# Patient Record
Sex: Male | Born: 1965 | Race: White | Marital: Married | Smoking: Never smoker
Health system: Southern US, Community
[De-identification: ages and names within clinical notes are randomized; demographics above are authoritative.]

## PROBLEM LIST (undated history)

## (undated) DIAGNOSIS — T7840XA Allergy, unspecified, initial encounter: Secondary | ICD-10-CM

## (undated) DIAGNOSIS — Z86010 Personal history of colonic polyps: Secondary | ICD-10-CM

## (undated) DIAGNOSIS — I10 Essential (primary) hypertension: Secondary | ICD-10-CM

## (undated) DIAGNOSIS — E785 Hyperlipidemia, unspecified: Secondary | ICD-10-CM

## (undated) DIAGNOSIS — G43909 Migraine, unspecified, not intractable, without status migrainosus: Secondary | ICD-10-CM

## (undated) DIAGNOSIS — F419 Anxiety disorder, unspecified: Secondary | ICD-10-CM

## (undated) DIAGNOSIS — E119 Type 2 diabetes mellitus without complications: Secondary | ICD-10-CM

## (undated) DIAGNOSIS — J45909 Unspecified asthma, uncomplicated: Secondary | ICD-10-CM

## (undated) HISTORY — DX: Anxiety disorder, unspecified: F41.9

## (undated) HISTORY — DX: Essential (primary) hypertension: I10

## (undated) HISTORY — DX: Type 2 diabetes mellitus without complications: E11.9

## (undated) HISTORY — DX: Unspecified asthma, uncomplicated: J45.909

## (undated) HISTORY — DX: Allergy, unspecified, initial encounter: T78.40XA

## (undated) HISTORY — DX: Hyperlipidemia, unspecified: E78.5

## (undated) HISTORY — DX: Personal history of colonic polyps: Z86.010

## (undated) HISTORY — PX: COLONOSCOPY: SHX174

## (undated) HISTORY — DX: Migraine, unspecified, not intractable, without status migrainosus: G43.909

---
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
## 1995-09-25 HISTORY — PX: LUMBAR DISC ARTHROPLASTY: SHX699

## 2005-09-24 HISTORY — PX: OTHER SURGICAL HISTORY: SHX169

## 2008-09-24 HISTORY — PX: KNEE ARTHROSCOPY: SHX127

## 2016-05-25 HISTORY — PX: SHOULDER ARTHROSCOPY WITH BICEPS TENDON REPAIR: SHX5674

## 2016-10-25 HISTORY — PX: SHOULDER ARTHROSCOPY: SHX128

## 2016-12-19 ENCOUNTER — Encounter: Payer: Self-pay | Admitting: Internal Medicine

## 2017-03-01 ENCOUNTER — Ambulatory Visit (AMBULATORY_SURGERY_CENTER): Payer: Self-pay

## 2017-03-01 VITALS — Ht 74.0 in | Wt 256.2 lb

## 2017-03-01 DIAGNOSIS — Z1211 Encounter for screening for malignant neoplasm of colon: Secondary | ICD-10-CM

## 2017-03-01 NOTE — Progress Notes (Signed)
No past problems with anesthesia No home oxygen No diet meds No allegies to eggs or soy  Registered emmi

## 2017-03-04 ENCOUNTER — Encounter: Payer: Self-pay | Admitting: Internal Medicine

## 2017-03-15 ENCOUNTER — Encounter: Payer: Self-pay | Admitting: Internal Medicine

## 2017-03-15 ENCOUNTER — Ambulatory Visit (AMBULATORY_SURGERY_CENTER): Payer: BLUE CROSS/BLUE SHIELD | Admitting: Internal Medicine

## 2017-03-15 VITALS — BP 114/78 | HR 73 | Temp 98.6°F | Resp 15 | Ht 74.0 in | Wt 256.0 lb

## 2017-03-15 DIAGNOSIS — D123 Benign neoplasm of transverse colon: Secondary | ICD-10-CM | POA: Diagnosis not present

## 2017-03-15 DIAGNOSIS — Z1212 Encounter for screening for malignant neoplasm of rectum: Secondary | ICD-10-CM | POA: Diagnosis not present

## 2017-03-15 DIAGNOSIS — D124 Benign neoplasm of descending colon: Secondary | ICD-10-CM

## 2017-03-15 DIAGNOSIS — D120 Benign neoplasm of cecum: Secondary | ICD-10-CM

## 2017-03-15 DIAGNOSIS — Z1211 Encounter for screening for malignant neoplasm of colon: Secondary | ICD-10-CM | POA: Diagnosis not present

## 2017-03-15 MED ORDER — SODIUM CHLORIDE 0.9 % IV SOLN
500.0000 mL | INTRAVENOUS | Status: DC
Start: 2017-03-15 — End: 2021-08-10

## 2017-03-15 NOTE — Op Note (Signed)
[facility] Patient Name: Roger Price Procedure Date: 03/15/2017 9:31 AM MRN: [id number] Endoscopist: Gatha Mayer , MD Age: 51 Referring MD:  Date of Birth: 04/16/1966 Gender: Male Account #: [id number] Procedure:                Colonoscopy Indications:              Screening for colorectal malignant neoplasm, This                            is the patient's first colonoscopy Medicines:                Propofol per Anesthesia, Monitored Anesthesia Care Procedure:                Pre-Anesthesia Assessment:                           - Prior to the procedure, a History and Physical                            was performed, and patient medications and                            allergies were reviewed. The patient's tolerance of                            previous anesthesia was also reviewed. The risks                            and benefits of the procedure and the sedation                            options and risks were discussed with the patient.                            All questions were answered, and informed consent                            was obtained. Prior Anticoagulants: The patient has                            taken no previous anticoagulant or antiplatelet                            agents. ASA Grade Assessment: II - A patient with                            mild systemic disease. After reviewing the risks                            and benefits, the patient was deemed in                            satisfactory condition to undergo the procedure.  After obtaining informed consent, the colonoscope                            was passed under direct vision. Throughout the                            procedure, the patient's blood pressure, pulse, and                            oxygen saturations were monitored continuously. The                            Colonoscope was introduced through the anus and   advanced to the the cecum, identified by                            appendiceal orifice and ileocecal valve. The                            colonoscopy was performed without difficulty. The                            patient tolerated the procedure well. The quality                            of the bowel preparation was good. The bowel                            preparation used was Miralax. The ileocecal valve,                            appendiceal orifice, and rectum were photographed. Scope In: 7:02:63 AM Scope Out: 10:09:27 AM Scope Withdrawal Time: 0 hours 29 minutes 37 seconds  Total Procedure Duration: 0 hours 33 minutes 9 seconds  Findings:                 The perianal and digital rectal examinations were                            normal. Pertinent negatives include normal prostate                            (size, shape, and consistency).                           Four sessile polyps were found in the descending                            colon and transverse colon. The polyps were 5 to 8                            mm in size. These polyps were removed with a cold  snare. Resection and retrieval were complete.                            Verification of patient identification for the                            specimen was done. Estimated blood loss was minimal.                           A 3 mm polyp was found in the ileocecal valve. The                            polyp was sessile. The polyp was removed with a                            cold biopsy forceps. Resection and retrieval were                            complete. Verification of patient identification                            for the specimen was done. Estimated blood loss was                            minimal.                           Internal hemorrhoids were found during retroflexion.                           The entire examined colon appeared normal on direct                             and retroflexion views. Complications:            No immediate complications. Estimated Blood Loss:     Estimated blood loss was minimal. Impression:               - Four 5 to 8 mm polyps in the descending colon and                            in the transverse colon, removed with a cold snare.                            Resected and retrieved.                           - One 3 mm polyp at the ileocecal valve, removed                            with a cold biopsy forceps. Resected and retrieved.                           - Internal hemorrhoids.                           -  The entire examined colon is normal on direct and                            retroflexion views. Recommendation:           - Patient has a contact number available for                            emergencies. The signs and symptoms of potential                            delayed complications were discussed with the                            patient. Return to normal activities tomorrow.                            Written discharge instructions were provided to the                            patient.                           - Resume previous diet.                           - Continue present medications.                           - Repeat colonoscopy is recommended. The                            colonoscopy date will be determined after pathology                            results from today's exam become available for                            review. Gatha Mayer, MD 03/15/2017 10:22:29 AM This report has been signed electronically.

## 2017-03-15 NOTE — Patient Instructions (Addendum)
   I found and removed 5 polyps - all look benign. Looks like you have internal hemorrhoids that are enlarged also - not usually an issue.  I appreciate the opportunity to care for you. Gatha Mayer, MD  Polyp handout given to patient. Hemorrhoid handout given to patient.  YOU HAD AN ENDOSCOPIC PROCEDURE TODAY AT [facility]:   Refer to the procedure report that was given to you for any specific questions about what was found during the examination.  If the procedure report does not answer your questions, please call your gastroenterologist to clarify.  If you requested that your care partner not be given the details of your procedure findings, then the procedure report has been included in a sealed envelope for you to review at your convenience later.  YOU SHOULD EXPECT: Some feelings of bloating in the abdomen. Passage of more gas than usual.  Walking can help get rid of the air that was put into your GI tract during the procedure and reduce the bloating. If you had a lower endoscopy (such as a colonoscopy or flexible sigmoidoscopy) you may notice spotting of blood in your stool or on the toilet paper. If you underwent a bowel prep for your procedure, you may not have a normal bowel movement for a few days.  Please Note:  You might notice some irritation and congestion in your nose or some drainage.  This is from the oxygen used during your procedure.  There is no need for concern and it should clear up in a day or so.  SYMPTOMS TO REPORT IMMEDIATELY:   Following lower endoscopy (colonoscopy or flexible sigmoidoscopy):  Excessive amounts of blood in the stool  Significant tenderness or worsening of abdominal pains  Swelling of the abdomen that is new, acute  Fever of 100F or higher For urgent or emergent issues, a gastroenterologist can be reached at any hour by calling [phone].   DIET:  We do recommend a small meal at first, but then you may proceed to  your regular diet.  Drink plenty of fluids but you should avoid alcoholic beverages for 24 hours.  ACTIVITY:  You should plan to take it easy for the rest of today and you should NOT DRIVE or use heavy machinery until tomorrow (because of the sedation medicines used during the test).    FOLLOW UP: Our staff will call the number listed on your records the next business day following your procedure to check on you and address any questions or concerns that you may have regarding the information given to you following your procedure. If we do not reach you, we will leave a message.  However, if you are feeling well and you are not experiencing any problems, there is no need to return our call.  We will assume that you have returned to your regular daily activities without incident.  If any biopsies were taken you will be contacted by phone or by letter within the next 1-3 weeks.  Please call us at [phone] if you have not heard about the biopsies in 3 weeks.    SIGNATURES/CONFIDENTIALITY: You and/or your care partner have signed paperwork which will be entered into your electronic medical record.  These signatures attest to the fact that that the information above on your After Visit Summary has been reviewed and is understood.  Full responsibility of the confidentiality of this discharge information lies with you and/or your care-partner.

## 2017-03-15 NOTE — Progress Notes (Signed)
To PACU VSS Report to RN 

## 2017-03-15 NOTE — Progress Notes (Signed)
Called to room to assist during endoscopic procedure.  Patient ID and intended procedure confirmed with present staff. Received instructions for my participation in the procedure from the performing physician.  

## 2017-03-15 NOTE — Progress Notes (Signed)
Pt's states no medical or surgical changes since previsit or office visit. 

## 2017-03-18 ENCOUNTER — Telehealth: Payer: Self-pay | Admitting: *Deleted

## 2017-03-18 NOTE — Telephone Encounter (Signed)
  Follow up Call-  Call back number 03/15/2017  Post procedure Call Back phone  # [phone]  Permission to leave phone message Yes     Patient questions:  Do you have a fever, pain , or abdominal swelling? No. Pain Score  0 *  Have you tolerated food without any problems? Yes.    Have you been able to return to your normal activities? Yes.    Do you have any questions about your discharge instructions: Diet   No. Medications  No. Follow up visit  No.  Do you have questions or concerns about your Care? No.  Actions: * If pain score is 4 or above: No action needed, pain <4.

## 2017-03-22 ENCOUNTER — Encounter: Payer: Self-pay | Admitting: Internal Medicine

## 2017-03-22 DIAGNOSIS — Z86010 Personal history of colonic polyps: Secondary | ICD-10-CM

## 2017-03-22 HISTORY — DX: Personal history of colonic polyps: Z86.010

## 2017-03-22 NOTE — Progress Notes (Signed)
5 adenomas max 8 mm Recall 2021

## 2020-08-24 HISTORY — PX: LUMBAR FUSION: SHX111

## 2021-05-22 ENCOUNTER — Other Ambulatory Visit: Payer: Self-pay

## 2021-05-22 ENCOUNTER — Other Ambulatory Visit: Payer: Self-pay | Admitting: Physician Assistant

## 2021-05-22 ENCOUNTER — Ambulatory Visit (INDEPENDENT_AMBULATORY_CARE_PROVIDER_SITE_OTHER): Payer: Self-pay

## 2021-05-22 DIAGNOSIS — T1490XA Injury, unspecified, initial encounter: Secondary | ICD-10-CM

## 2021-05-22 DIAGNOSIS — M79641 Pain in right hand: Secondary | ICD-10-CM

## 2021-05-22 DIAGNOSIS — S6981XA Other specified injuries of right wrist, hand and finger(s), initial encounter: Secondary | ICD-10-CM

## 2021-08-10 ENCOUNTER — Encounter: Payer: Self-pay | Admitting: Internal Medicine

## 2021-08-10 ENCOUNTER — Ambulatory Visit (INDEPENDENT_AMBULATORY_CARE_PROVIDER_SITE_OTHER): Payer: BC Managed Care – PPO | Admitting: Internal Medicine

## 2021-08-10 VITALS — BP 154/72 | Temp 90.0°F | Ht 74.0 in | Wt 275.0 lb

## 2021-08-10 DIAGNOSIS — D649 Anemia, unspecified: Secondary | ICD-10-CM | POA: Diagnosis not present

## 2021-08-10 DIAGNOSIS — Z86010 Personal history of colonic polyps: Secondary | ICD-10-CM | POA: Diagnosis not present

## 2021-08-10 DIAGNOSIS — K5903 Drug induced constipation: Secondary | ICD-10-CM | POA: Diagnosis not present

## 2021-08-10 DIAGNOSIS — Z860101 Personal history of adenomatous and serrated colon polyps: Secondary | ICD-10-CM

## 2021-08-10 NOTE — Progress Notes (Signed)
Roger Price 55 y.o. 1965/10/16 [id number]  Assessment & Plan:   Encounter Diagnoses  Name Primary?   Mild anemia - resolved Yes   Hx of adenomatous colonic polyps    Drug-induced constipation - ferrous sulfate      Stop ferrous sulfate.  Ferritin is normal.  Cannot rely on isolated iron level.  His hemoglobin has normalized as well.  I suspect the anemia was somewhat multifactorial i.e. surgery, question component of chronic disease with his diabetes.  Hopefully stopping the ferrous sulfate will relieve his constipation.  Schedule colonoscopy for polyp surveillance.The risks and benefits as well as alternatives of endoscopic procedure(s) have been discussed and reviewed. All questions answered. The patient agrees to proceed.  I appreciate the opportunity to care for this patient. CC: Manfred Shirts, PA  Subjective:   Chief Complaint: History of colon polyps some change in bowel habits  HPI Patient is a 55 year old white man who was recently diagnosed with suspected iron deficiency anemia by primary care and treated with iron.  After that he started having a constipation issues.  He will go several days without a bowel movement or a decent bowel movement and then have a normal bowel movement followed by several more progressively looser stools.  He has not tried any particular laxatives.  There are some accompanying bloating and gaseousness.  His ferritin has never been abnormal that I can see, his iron levels have fluctuated as well.  His hemoglobin was slightly low and is now normal.  This started after he had spinal surgery.  He has not seen any bleeding.  Stools are darker on the iron.  Colonoscopy in 2018 with 5 adenomas max 8 mm.  On April 07, 2021 hemoglobin was 13.1 hematocrit 39.5 and MCV 88.5 B12 level was normal at 447 Ferritin was 121.8 and iron was 143  05/05/2021 his hemoglobin was 13.7 and his ferritin was 62.7   On 08/03/2021 ferritin was 79.5 and hemoglobin  normal at 14.6 again normocytic Sh[facility] surgery Allergies  Allergen Reactions   Talwin [Pentazocine] Shortness Of Breath    SOB rash   Current Meds  Medication Sig   albuterol (PROVENTIL HFA;VENTOLIN HFA) 108 (90 Base) MCG/ACT inhaler Inhale into the lungs every 6 (six) hours as needed for wheezing or shortness of breath.   amitriptyline (ELAVIL) 25 MG tablet Take 25 mg by mouth at bedtime.   amLODipine-olmesartan (AZOR) 5-40 MG tablet Take 1 tablet by mouth daily.   carisoprodol (SOMA) 350 MG tablet Take 350 mg by mouth daily.   DULoxetine (CYMBALTA) 30 MG capsule Take 30 mg by mouth daily.   ergocalciferol (VITAMIN D2) 1.25 MG (50000 UT) capsule Take 50,000 Units by mouth once a week.   insulin glargine (LANTUS) 100 UNIT/ML Solostar Pen Inject 48 Units into the skin daily.   iron polysaccharides (NIFEREX) 150 MG capsule Take 150 mg by mouth daily.   LORazepam (ATIVAN) 0.5 MG tablet Take 0.5 mg by mouth at bedtime.   metFORMIN (GLUCOPHAGE) 500 MG tablet Take 1,000 mg by mouth daily with breakfast.   montelukast (SINGULAIR) 10 MG tablet Take 10 mg by mouth at bedtime.   nabumetone (RELAFEN) 750 MG tablet Take 750 mg by mouth daily.   pioglitazone (ACTOS) 45 MG tablet Take 1 tablet by mouth daily.   pregabalin (LYRICA) 75 MG capsule Take 75 mg by mouth 2 (two) times daily.   rizatriptan (MAXALT) 10 MG tablet Take 10 mg by mouth as needed for migraine. May repeat in  2 hours if needed   rosuvastatin (CRESTOR) 10 MG tablet Take 10 mg by mouth daily.   Semaglutide, 1 MG/DOSE, (OZEMPIC, 1 MG/DOSE,) 2 MG/1.5ML SOPN Inject 1 mg into the skin once a week.   zolpidem (AMBIEN) 5 MG tablet Take 5 mg by mouth at bedtime.   Past Medical History:  Diagnosis Date   Allergy    Anxiety    Asthma    Diabetes mellitus without complication ([place])    Herniated lumbar intervertebral disc    Hx of adenomatous colonic polyps 03/22/2017   Hyperlipidemia    Hypertension    Migraines    Past Surgical  History:  Procedure Laterality Date   COLONOSCOPY     KNEE ARTHROSCOPY  1985   x2 left   KNEE ARTHROSCOPY  2010   right x2   LUMBAR DISC ARTHROPLASTY  1997   L-5,S-1   LUMBAR FUSION  08/2020   dr. Katherine Roan - [facility]   roatator cuff  2007   left   SHOULDER ARTHROSCOPY  10/2016   left; clavicle excision   SHOULDER ARTHROSCOPY WITH BICEPS TENDON REPAIR  05/2016   left torn tendon bicep   Social History   Social History Narrative   He is married he has 2 grown daughters   He works as a Associate Professor   Never smoker 3 caffeinated beverages daily no alcohol no drug use   family history includes Breast cancer in his maternal grandmother and mother; Diabetes in his father; Emphysema in his paternal grandfather; Heart attack in his maternal grandfather; Heart disease in his father; Hypertension in his father; Skin cancer in his mother and paternal grandmother; Ulcerative colitis in his daughter.   Review of Systems See HPI all other review of systems appear negative  Objective:   Physical Exam BP (!) 154/72   Temp (!) 90 F (32.2 C)   Ht 6\' 2"  (1.88 m)   Wt 275 lb (124.7 kg)   SpO2 97%   BMI 35.31 kg/m   well-developed well-nourished white man no acute distress Lungs cta Cor NL S1S2 no rmg Abd mild LLQ tenderness, BS + no mass, HSM Rectal exam is deferred until colonoscopy Alert and oriented x 3

## 2021-08-10 NOTE — Patient Instructions (Signed)
You have been scheduled for a colonoscopy. Please follow written instructions given to you at your visit today.  Please pick up your prep supplies at the pharmacy within the next 1-3 days. If you use inhalers (even only as needed), please bring them with you on the day of your procedure.   Due to recent changes in healthcare laws, you may see the results of your imaging and laboratory studies on MyChart before your provider has had a chance to review them.  We understand that in some cases there may be results that are confusing or concerning to you. Not all laboratory results come back in the same time frame and the provider may be waiting for multiple results in order to interpret others.  Please give [place] 48 hours in order for your provider to thoroughly review all the results before contacting the office for clarification of your results.   If you are age 40 or older, your body mass index should be between 23-30. Your Body mass index is 35.31 kg/m. If this is out of the aforementioned range listed, please consider follow up with your Primary Care Provider.  If you are age 32 or younger, your body mass index should be between 19-25. Your Body mass index is 35.31 kg/m. If this is out of the aformentioned range listed, please consider follow up with your Primary Care Provider.   ________________________________________________________  The [facility] providers would like to encourage you to use [facility] to communicate with providers for non-urgent requests or questions.  Due to long hold times on the telephone, sending your provider a message by [facility] may be a faster and more efficient way to get a response.  Please allow 48 business hours for a response.  Please remember that this is for non-urgent requests.  _______________________________________________________  I appreciate the opportunity to care for you. Silvano Rusk, MD, [facility]

## 2021-08-11 ENCOUNTER — Encounter: Payer: Self-pay | Admitting: Internal Medicine

## 2021-08-14 ENCOUNTER — Encounter: Payer: Self-pay | Admitting: Internal Medicine

## 2021-08-17 ENCOUNTER — Encounter: Payer: Self-pay | Admitting: Internal Medicine

## 2021-08-21 ENCOUNTER — Encounter: Payer: BC Managed Care – PPO | Admitting: Internal Medicine

## 2021-08-23 NOTE — Telephone Encounter (Signed)
Pt was rescheduled for a Colonoscopy for 09/06/2021 @ 4:00 PM with Dr. Carlean Purl in the [facility]. Pt made aware. Updated Prep instructions was sent to pt via my chart and Mail. Pt made aware Pt verbalized understanding with all questions answered.

## 2021-08-24 ENCOUNTER — Telehealth: Payer: Self-pay | Admitting: Internal Medicine

## 2021-08-24 NOTE — Telephone Encounter (Signed)
Patient called to cancel his procedure for 09/06/21 at 4:00 p.m. with Dr. Carlean Purl.  He said he had too many back-to-back appointments that week and couldn't do it that day.  He is rescheduled for 10/06/21 at 11:00 a.m. but asked if there were a cancellation for this year to please call him.  Thank you.

## 2021-09-06 ENCOUNTER — Encounter: Payer: BC Managed Care – PPO | Admitting: Internal Medicine

## 2021-10-06 ENCOUNTER — Encounter: Payer: BC Managed Care – PPO | Admitting: Internal Medicine

## 2023-02-11 IMAGING — DX DG HAND COMPLETE 3+V*R*
3 series · 3 of 3 positions shown · non-contrast
Comparison: None.

CLINICAL DATA: Blunt trauma with hand pain, initial encounter

EXAM:
RIGHT HAND - COMPLETE 3+ VIEW

[hand pa]
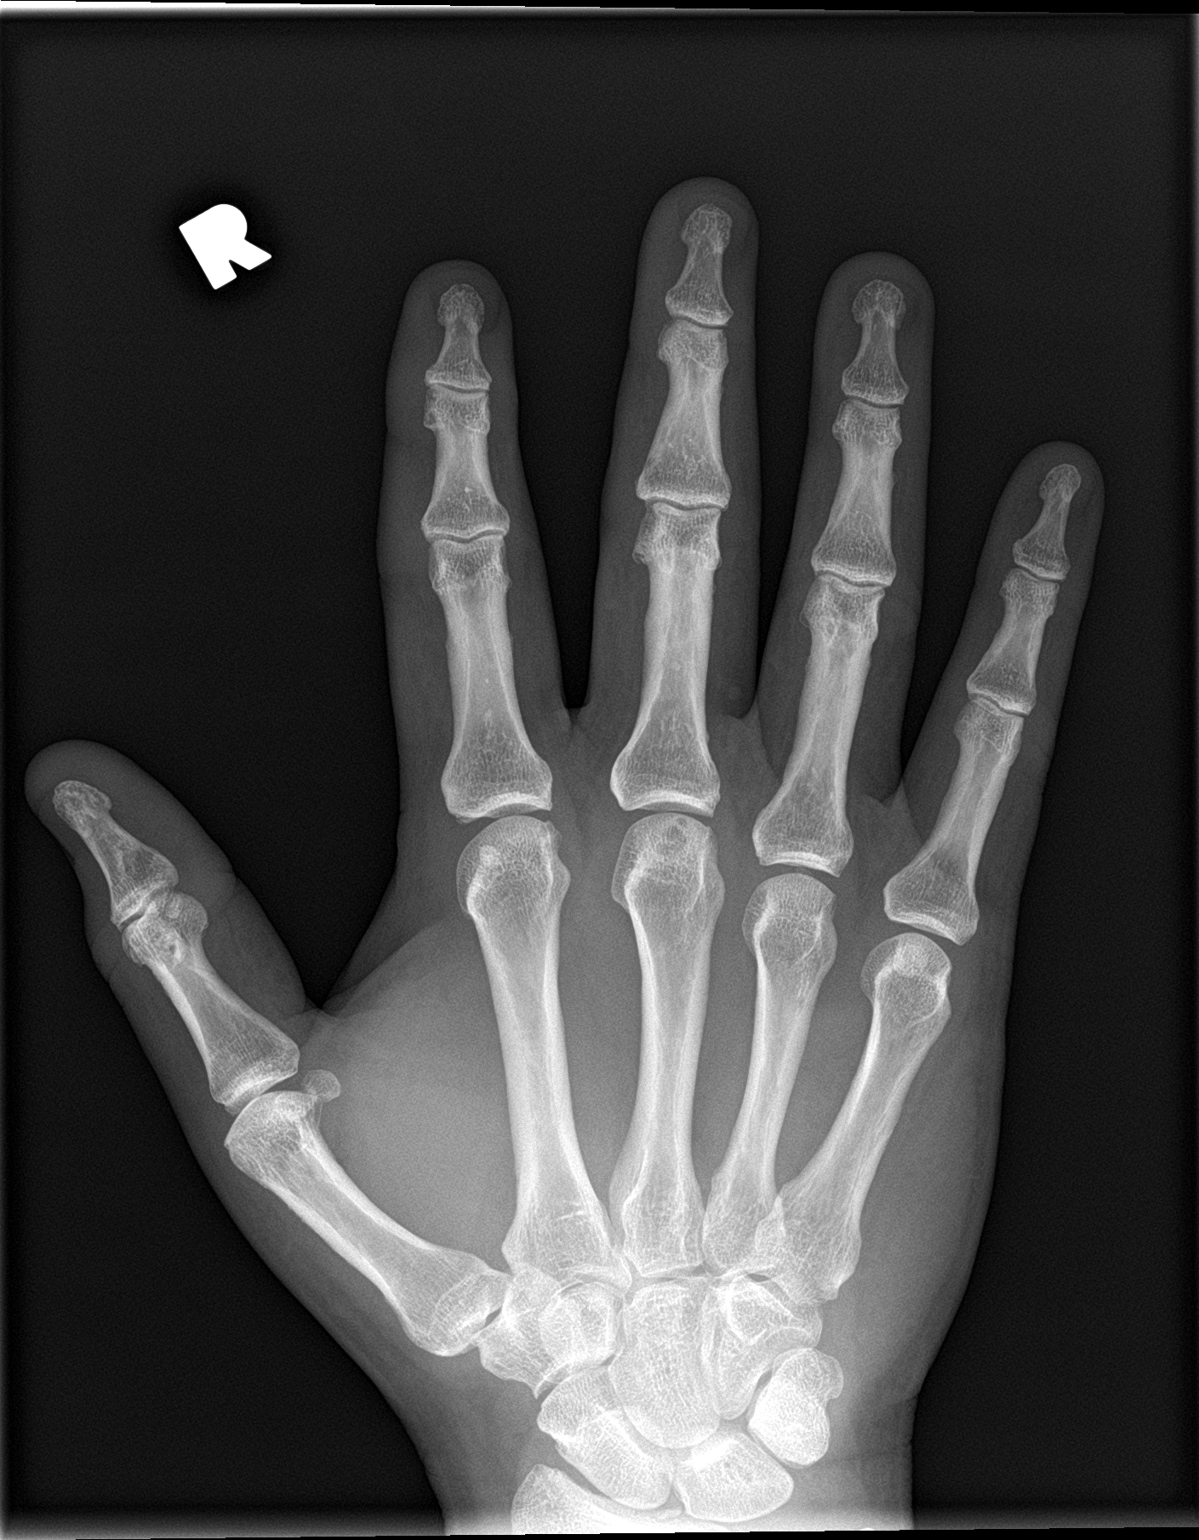

[hand obl]
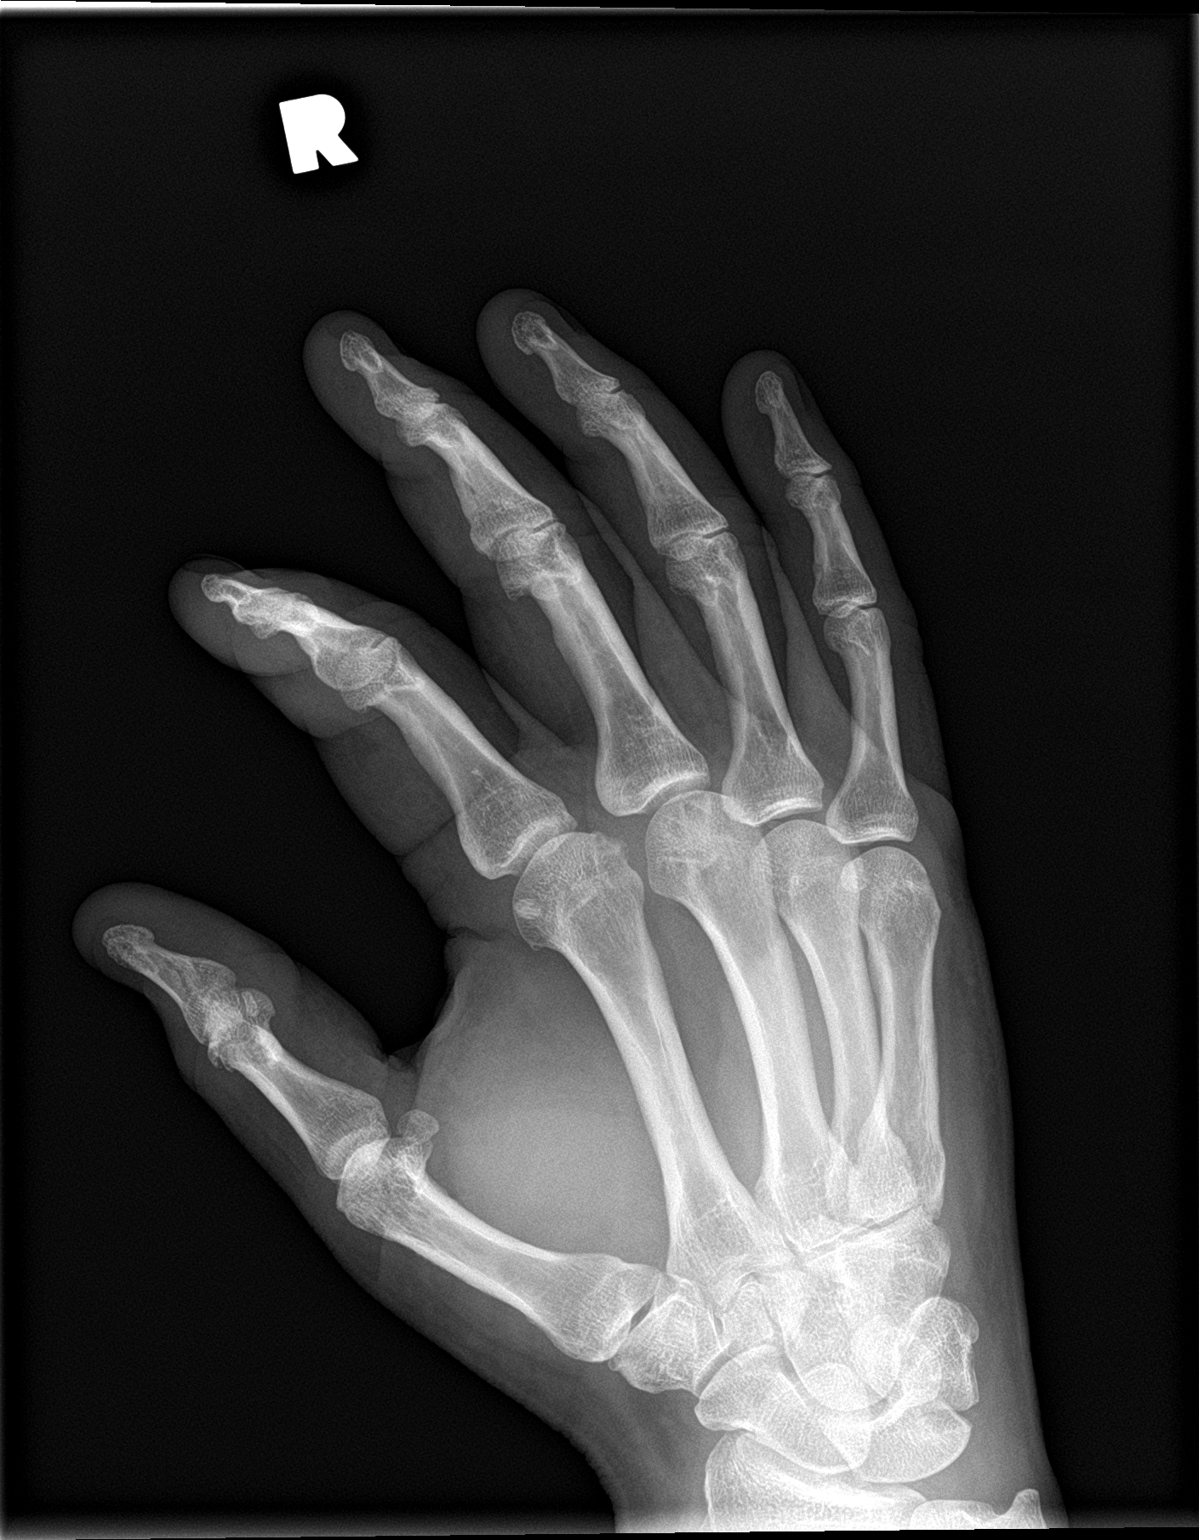

[hand lat]
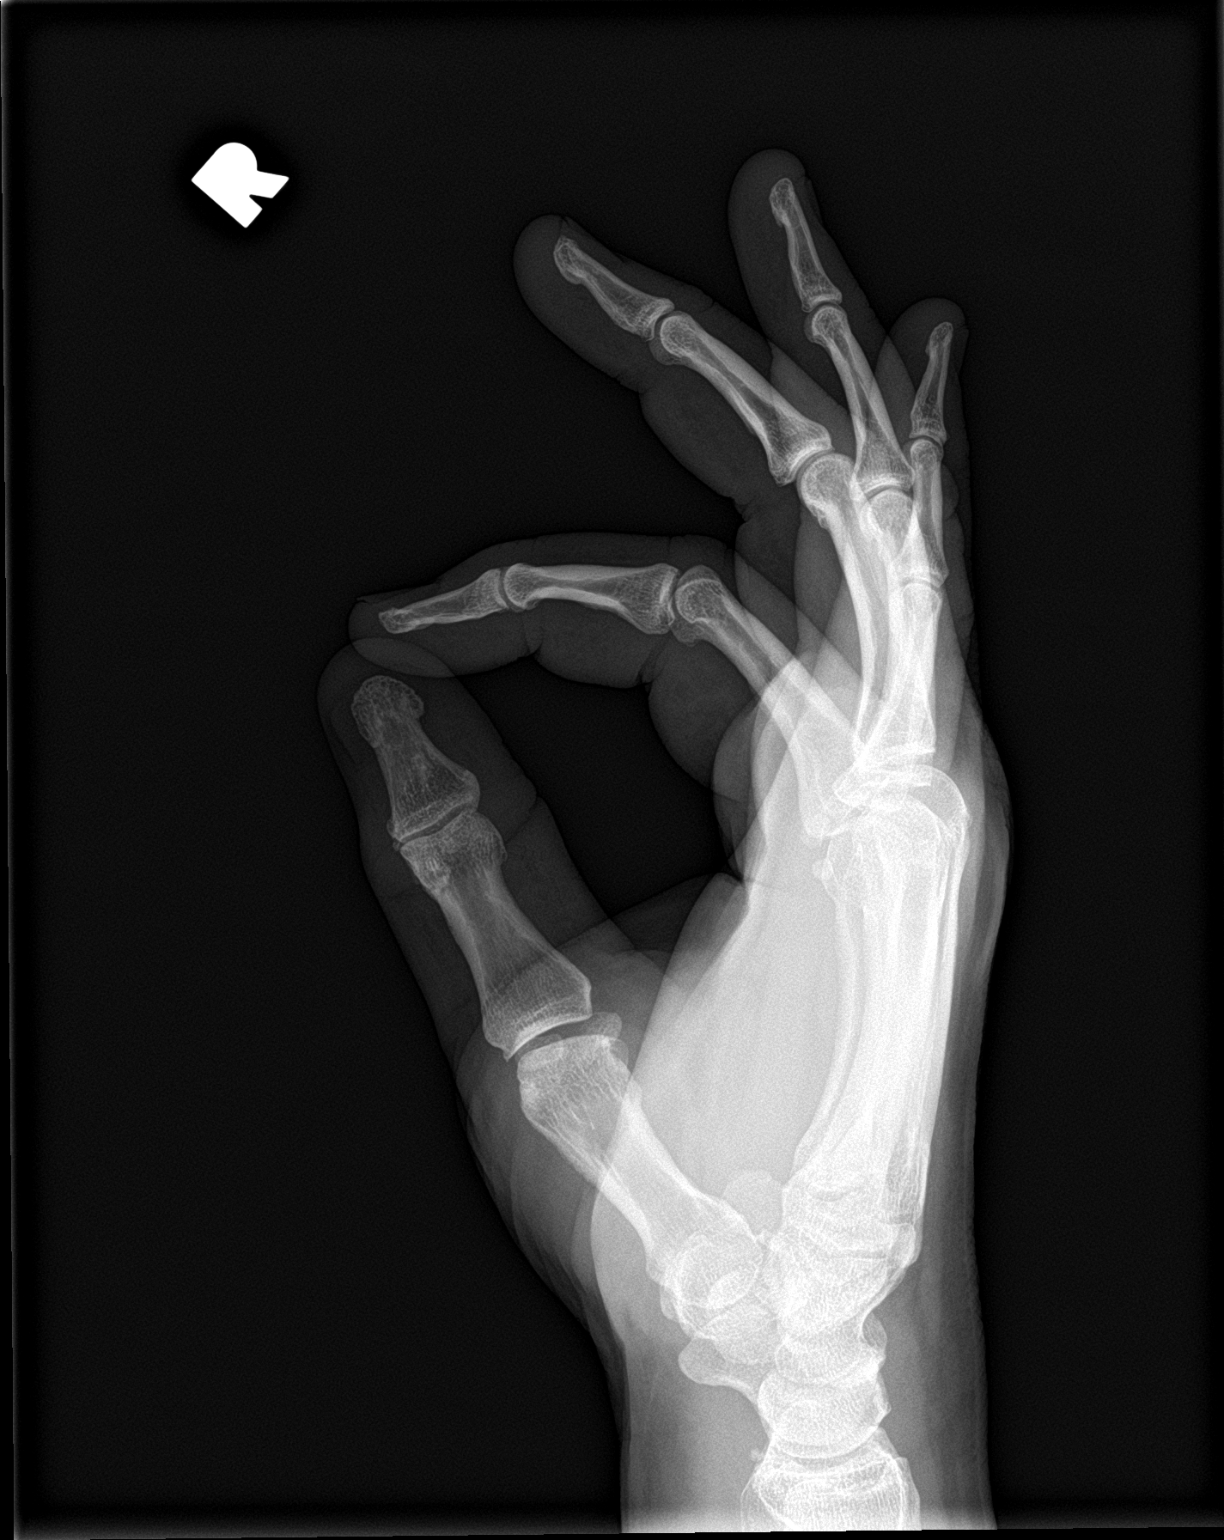

[3 of 3 positions shown; findings below may reference images not displayed]

FINDINGS: There is no evidence of fracture or dislocation. There is no
evidence of arthropathy or other focal bone abnormality. Soft
tissues are unremarkable.
IMPRESSION: No acute abnormality noted.

## 2023-11-26 ENCOUNTER — Telehealth: Payer: Self-pay

## 2023-11-26 ENCOUNTER — Ambulatory Visit (AMBULATORY_SURGERY_CENTER): Payer: BC Managed Care – PPO

## 2023-11-26 VITALS — Ht 74.0 in | Wt 250.0 lb

## 2023-11-26 DIAGNOSIS — Z860100 Personal history of colon polyps, unspecified: Secondary | ICD-10-CM

## 2023-11-26 DIAGNOSIS — D509 Iron deficiency anemia, unspecified: Secondary | ICD-10-CM

## 2023-11-26 NOTE — Telephone Encounter (Signed)
 EGD has been added. VM left for patient making him aware.

## 2023-11-26 NOTE — Addendum Note (Signed)
 Addended by: Darylene Price on: 11/26/2023 12:19 PM   Modules accepted: Orders

## 2023-11-26 NOTE — Telephone Encounter (Signed)
 Dr. Leone Payor,   Pt mentioned to me at [facility] he is dealing with some Anemia. Per his PCP he was to ask about having an EGD to rule out any if he is bleeding from somewhere. Please advise if he will need an OV or if he is ok to schedule EGD directly.

## 2023-11-26 NOTE — Telephone Encounter (Signed)
 I reviewed his labs and he does have an iron deficiency anemia  Please also schedule an EGD looks like we are able to do a double that same day from my checking of the schedule  Encounter Diagnosis  Name Primary?   Iron deficiency anemia, unspecified iron deficiency anemia type Yes

## 2023-11-26 NOTE — Progress Notes (Signed)

## 2023-12-04 ENCOUNTER — Encounter: Payer: Self-pay | Admitting: Internal Medicine

## 2023-12-09 ENCOUNTER — Encounter: Payer: Self-pay | Admitting: Internal Medicine

## 2023-12-10 ENCOUNTER — Telehealth: Payer: Self-pay | Admitting: Internal Medicine

## 2023-12-10 NOTE — Progress Notes (Unsigned)
 [facility] History and Physical   Primary Care Physician:  Shellia Cleverly, [place]   Reason for Procedure:   Iron deficiency anemia and also has hx colon polyps  Plan:    EGD and colonoscopy     HPI: Roger Price is a 58 y.o. male w/ hx colon polyps - and also recent findings of iron deficiency anemia.He has been experiencing some dysphagia w/ rice x several months. No bleeding. Eats red meat. Not a frequent blood donor.   Hgb 13.8, MCV 81 and ferritin 10 in 09/2023 03/15/17 colonoscopy -  5 adenomas max 8 mm  Past Medical History:  Diagnosis Date   Allergy    Anxiety    Asthma    Diabetes mellitus without complication (HCC)    Herniated lumbar intervertebral disc    Hx of adenomatous colonic polyps 03/22/2017   Hyperlipidemia    Hypertension    Migraines     Past Surgical History:  Procedure Laterality Date   COLONOSCOPY     KNEE ARTHROSCOPY  1985   x2 left   KNEE ARTHROSCOPY  2010   right x2   LUMBAR DISC ARTHROPLASTY  1997   L-5,S-1   LUMBAR FUSION  08/2020   dr. Petra Kuba - [facility]   roatator cuff  2007   left   SHOULDER ARTHROSCOPY  10/2016   left; clavicle excision   SHOULDER ARTHROSCOPY WITH BICEPS TENDON REPAIR  05/2016   left torn tendon bicep    Prior to Admission medications   Medication Sig Start Date End Date Taking? Authorizing Provider  acetaminophen (TYLENOL) 500 MG tablet Take 500 mg by mouth every 6 (six) hours as needed.    [provider]  albuterol (PROVENTIL HFA;VENTOLIN HFA) 108 (90 Base) MCG/ACT inhaler Inhale into the lungs every 6 (six) hours as needed for wheezing or shortness of breath.    [provider]  amitriptyline (ELAVIL) 25 MG tablet Take 25 mg by mouth at bedtime. Patient not taking: Reported on 11/26/2023 05/23/21   [provider]  amLODipine-olmesartan (AZOR) 5-40 MG tablet Take 1 tablet by mouth daily. 12/04/19   [provider]  anastrozole (ARIMIDEX) 1 MG tablet Take 1 mg by  mouth once a week.    [provider]  carisoprodol (SOMA) 350 MG tablet Take 350 mg by mouth daily.    [provider]  DULoxetine (CYMBALTA) 30 MG capsule Take 30 mg by mouth daily.    [provider]  ergocalciferol (VITAMIN D2) 1.25 MG (50000 UT) capsule Take 50,000 Units by mouth once a week.    [provider]  fluticasone (FLONASE) 50 MCG/ACT nasal spray Place 2 sprays into both nostrils daily. 11/29/15   [provider]  fluticasone-salmeterol (ADVAIR) 250-50 MCG/ACT AEPB Inhale 1 puff into the lungs 2 (two) times daily as needed. 04/20/22   [provider]  iron polysaccharides (NIFEREX) 150 MG capsule Take 150 mg by mouth daily. Patient not taking: Reported on 11/26/2023    [provider]  LORazepam (ATIVAN) 0.5 MG tablet Take 0.5 mg by mouth at bedtime.    [provider]  Magnesium Glycinate 100 MG CAPS Take 3 capsules by mouth daily at 6 (six) AM. 06/29/23   [provider]  meclizine (ANTIVERT) 25 MG tablet Take 25 mg by mouth 2 (two) times daily as needed. 09/28/22   [provider]  metFORMIN (GLUCOPHAGE) 500 MG tablet Take 1,000 mg by mouth daily with breakfast.    [provider]  mirabegron ER (MYRBETRIQ) 50 MG TB24 tablet Take 1 tablet by mouth daily. 11/25/23 11/24/24  [provider]  montelukast (SINGULAIR) 10 MG tablet Take 10 mg by mouth at bedtime. Patient not taking: Reported on 11/26/2023    [provider]  nabumetone (RELAFEN) 750 MG tablet Take 750 mg by mouth daily. Patient not taking: Reported on 11/26/2023    [provider]  olmesartan (BENICAR) 40 MG tablet Take 20 mg by mouth daily. 03/28/22   [provider]  pantoprazole (PROTONIX) 40 MG tablet Take 40 mg by mouth daily.    [provider]  pioglitazone (ACTOS) 45 MG tablet Take 1 tablet by mouth daily. 05/04/19   [provider]  pregabalin (LYRICA) 75 MG capsule Take 75 mg  by mouth 2 (two) times daily. Patient not taking: Reported on 11/26/2023    [provider]  rizatriptan (MAXALT) 10 MG tablet Take 10 mg by mouth as needed for migraine. May repeat in 2 hours if needed    [provider]  rosuvastatin (CRESTOR) 10 MG tablet Take 10 mg by mouth daily.    [provider]  tadalafil (CIALIS) 20 MG tablet Take 20 mg by mouth daily as needed. 10/04/23 10/03/24  [provider]  tamsulosin (FLOMAX) 0.4 MG CAPS capsule Take 0.4 mg by mouth daily.    [provider]  zolpidem (AMBIEN) 5 MG tablet Take 5 mg by mouth at bedtime. Patient not taking: Reported on 11/26/2023    [provider]    Current Outpatient Medications  Medication Sig Dispense Refill   acetaminophen (TYLENOL) 500 MG tablet Take 500 mg by mouth every 6 (six) hours as needed.     anastrozole (ARIMIDEX) 1 MG tablet Take 1 mg by mouth once a week.     carisoprodol (SOMA) 350 MG tablet Take 350 mg by mouth daily.     DULoxetine (CYMBALTA) 30 MG capsule Take 30 mg by mouth daily.     ergocalciferol (VITAMIN D2) 1.25 MG (50000 UT) capsule Take 50,000 Units by mouth once a week.     fluticasone (FLONASE) 50 MCG/ACT nasal spray Place 2 sprays into both nostrils daily.     LORazepam (ATIVAN) 0.5 MG tablet Take 0.5 mg by mouth at bedtime.     metFORMIN (GLUCOPHAGE) 500 MG tablet Take 1,000 mg by mouth daily with breakfast.     olmesartan (BENICAR) 40 MG tablet Take 20 mg by mouth daily.     pantoprazole (PROTONIX) 40 MG tablet Take 40 mg by mouth daily.     pioglitazone (ACTOS) 45 MG tablet Take 1 tablet by mouth daily.     rosuvastatin (CRESTOR) 10 MG tablet Take 10 mg by mouth daily.     tamsulosin (FLOMAX) 0.4 MG CAPS capsule Take 0.4 mg by mouth daily.     albuterol (PROVENTIL HFA;VENTOLIN HFA) 108 (90 Base) MCG/ACT inhaler Inhale into the lungs every 6 (six) hours as needed for wheezing or shortness of breath.     fluticasone-salmeterol (ADVAIR) 250-50  MCG/ACT AEPB Inhale 1 puff into the lungs 2 (two) times daily as needed.     meclizine (ANTIVERT) 25 MG tablet Take 25 mg by mouth 2 (two) times daily as needed.     mirabegron ER (MYRBETRIQ) 50 MG TB24 tablet Take 1 tablet by mouth daily.     montelukast (SINGULAIR) 10 MG tablet Take 10 mg by mouth at bedtime. (Patient not taking: Reported on 12/11/2023)     rizatriptan (MAXALT) 10 MG  tablet Take 10 mg by mouth as needed for migraine. May repeat in 2 hours if needed     tadalafil (CIALIS) 20 MG tablet Take 20 mg by mouth daily as needed.     Current Facility-Administered Medications  Medication Dose Route Frequency Provider Last Rate Last Admin   0.9 %  sodium chloride infusion  500 mL Intravenous Once Iva Boop, MD        Allergies as of 12/11/2023 - Review Complete 12/11/2023  Allergen Reaction Noted   Pentazocine Shortness Of Breath and Rash 08/06/2014   Aspirin Other (See Comments) 01/09/2017    Family History  Problem Relation Age of Onset   Skin cancer Mother    Breast cancer Mother    Diabetes Father    Hypertension Father    Heart disease Father    Breast cancer Maternal Grandmother    Heart attack Maternal Grandfather    Skin cancer Paternal Grandmother    Emphysema Paternal Grandfather    Ulcerative colitis Daughter        DX at 36   Colon cancer Neg Hx    Stomach cancer Neg Hx    Esophageal cancer Neg Hx    Rectal cancer Neg Hx     Social History   Socioeconomic History   Marital status: Married    Spouse name: Not on file   Number of children: Not on file   Years of education: Not on file   Highest education level: Not on file  Occupational History   Not on file  Tobacco Use   Smoking status: Never   Smokeless tobacco: Never  Vaping Use   Vaping status: Never Used  Substance and Sexual Activity   Alcohol use: No   Drug use: No   Sexual activity: Not on file  Other Topics Concern   Not on file  Social History Narrative   He is married he has  2 grown daughters   He works as a Administrator, [employer]   Never smoker 3 caffeinated beverages daily no alcohol no drug use   Social Drivers of Corporate investment banker Strain: Low Risk  (08/05/2023)   Received from [employer]   Overall Financial Resource Strain (CARDIA)    Difficulty of Paying Living Expenses: Not very hard  Food Insecurity: Low Risk  (10/09/2023)   Received from [facility]   Hunger Vital Sign    Worried About Running Out of Food in the Last Year: Never true    Ran Out of Food in the Last Year: Never true  Transportation Needs: No Transportation Needs (10/09/2023)   Received from [facility]    In the past 12 months, has lack of reliable transportation kept you from medical appointments, meetings, work or from getting things needed for daily living? : No  Physical Activity: Sufficiently Active (08/05/2023)   Received from [facility]   Exercise Vital Sign    Days of Exercise per Week: 5 days    Minutes of Exercise per Session: 90 min  Stress: No Stress Concern Present (08/05/2023)   Received from [facility] - Occupational Stress Questionnaire    Feeling of Stress : Only a little  Social Connections: Patient Declined (08/05/2023)   Received from [facility]   Social Network    How would you rate your social network (family, work, friends)?: Patient declined  Intimate Partner Violence: Not At Risk (08/05/2023)   Received from  [facility]   HITS    Over the last 12 months how often did your partner physically hurt you?: Never    Over the last 12 months how often did your partner insult you or talk down to you?: Sometimes    Over the last 12 months how often did your partner threaten you with physical harm?: Never    Over the last 12 months how often did your partner scream or curse at you?: Never    Review of Systems:  All other review of systems negative except as mentioned in the  HPI.  Physical Exam: Vital signs BP (!) 148/84   Pulse 78   Temp 98 F (36.7 C) (Temporal)   Resp 17   Ht 6\' 2"  (1.88 m)   Wt 250 lb (113.4 kg)   SpO2 97%   BMI 32.10 kg/m   General:   Alert,  Well-developed, well-nourished, pleasant and cooperative in NAD Lungs:  Clear throughout to auscultation.   Heart:  Regular rate and rhythm; no murmurs, clicks, rubs,  or gallops. Abdomen:  Soft, nontender and nondistended. Normal bowel sounds.   Neuro/Psych:  Alert and cooperative. Normal mood and affect. A and O x 3   @Dyshaun Bonzo  Sena Slate, MD, [facility] [phone] (pager) 12/11/2023 9:38 AM@

## 2023-12-10 NOTE — Telephone Encounter (Signed)
 PT has not received prep medication and very upset. His procedure is tomorrow and he doesn't know what to do at this point. Please advise

## 2023-12-10 NOTE — Telephone Encounter (Signed)
 Unable to reach pt VM left. Pt has miralax prep. Called pt to make him aware all prep products are purchased over the counter. 238 gram bottle miralax powder 64 oz bottle of Gatorade (no red or purple) and 1 box Dulcolax laxative tablets. Made him aware this info can also be found in his mychart as well as the hard copy of instructions that were mailed to his address. If pt prefers rx to be sent for a diff prep instructed for him to call back so that can be arranged

## 2023-12-11 ENCOUNTER — Encounter: Payer: Self-pay | Admitting: Internal Medicine

## 2023-12-11 ENCOUNTER — Ambulatory Visit: Payer: BC Managed Care – PPO | Admitting: Internal Medicine

## 2023-12-11 VITALS — BP 124/61 | HR 78 | Temp 98.0°F | Resp 13 | Ht 74.0 in | Wt 250.0 lb

## 2023-12-11 DIAGNOSIS — K3189 Other diseases of stomach and duodenum: Secondary | ICD-10-CM | POA: Diagnosis not present

## 2023-12-11 DIAGNOSIS — D123 Benign neoplasm of transverse colon: Secondary | ICD-10-CM

## 2023-12-11 DIAGNOSIS — K2970 Gastritis, unspecified, without bleeding: Secondary | ICD-10-CM | POA: Diagnosis not present

## 2023-12-11 DIAGNOSIS — Z860101 Personal history of adenomatous and serrated colon polyps: Secondary | ICD-10-CM

## 2023-12-11 DIAGNOSIS — R1310 Dysphagia, unspecified: Secondary | ICD-10-CM

## 2023-12-11 DIAGNOSIS — D509 Iron deficiency anemia, unspecified: Secondary | ICD-10-CM | POA: Diagnosis not present

## 2023-12-11 DIAGNOSIS — Z860100 Personal history of colon polyps, unspecified: Secondary | ICD-10-CM

## 2023-12-11 DIAGNOSIS — K648 Other hemorrhoids: Secondary | ICD-10-CM | POA: Diagnosis not present

## 2023-12-11 DIAGNOSIS — Z1211 Encounter for screening for malignant neoplasm of colon: Secondary | ICD-10-CM | POA: Diagnosis present

## 2023-12-11 DIAGNOSIS — R1319 Other dysphagia: Secondary | ICD-10-CM

## 2023-12-11 DIAGNOSIS — K635 Polyp of colon: Secondary | ICD-10-CM | POA: Diagnosis not present

## 2023-12-11 MED ORDER — FERROUS FUMARATE 324 (106 FE) MG PO TABS: 1.0000 | ORAL_TABLET | ORAL | Status: AC

## 2023-12-11 MED ORDER — SODIUM CHLORIDE 0.9 % IV SOLN: 500.0000 mL | Freq: Once | INTRAVENOUS | Status: DC

## 2023-12-11 NOTE — Progress Notes (Signed)
 Pt's states no medical or surgical changes since previsit or office visit.

## 2023-12-11 NOTE — Progress Notes (Signed)
 Called to room to assist during endoscopic procedure.  Patient ID and intended procedure confirmed with present staff. Received instructions for my participation in the procedure from the performing physician.

## 2023-12-11 NOTE — Op Note (Addendum)
 [facility] Patient Name: Roger Price Procedure Date: 12/11/2023 9:30 AM MRN: [id number] Endoscopist: Iva Boop , MD, [id number] Age: 58 Referring MD:  Date of Birth: 30-Jun-1966 Gender: Male Account #: [id number] Procedure:                Upper GI endoscopy Indications:              Iron deficiency anemia, Dysphagia (when eating rice) Medicines:                Monitored Anesthesia Care Procedure:                Pre-Anesthesia Assessment:                           - Prior to the procedure, a History and Physical                            was performed, and patient medications and                            allergies were reviewed. The patient's tolerance of                            previous anesthesia was also reviewed. The risks                            and benefits of the procedure and the sedation                            options and risks were discussed with the patient.                            All questions were answered, and informed consent                            was obtained. Prior Anticoagulants: The patient has                            taken no anticoagulant or antiplatelet agents. ASA                            Grade Assessment: III - A patient with severe                            systemic disease. After reviewing the risks and                            benefits, the patient was deemed in satisfactory                            condition to undergo the procedure.                           After obtaining informed consent, the endoscope was  passed under direct vision. Throughout the                            procedure, the patient's blood pressure, pulse, and                            oxygen saturations were monitored continuously. The                            GIF HQ190 #[id number] was introduced through the                            mouth, and advanced to the second part of duodenum.                             The upper GI endoscopy was accomplished without                            difficulty. The patient tolerated the procedure                            well. Scope In: Scope Out: Findings:                 No endoscopic abnormality was evident in the                            esophagus to explain the patient's complaint of                            dysphagia. It was decided, however, to proceed with                            dilation of the entire esophagus. The scope was                            withdrawn. Dilation was performed with a Maloney                            dilator with no resistance at 54 Fr. The dilation                            site was examined following endoscope reinsertion                            and showed no change. Estimated blood loss: none.                           Patchy inflammation characterized by erosions,                            erythema and granularity was found in the gastric  antrum. Biopsies were taken with a cold forceps for                            histology. Verification of patient identification                            for the specimen was done. Cells for cytology were                            obtained. Verification of patient identification                            for the specimen was done. Estimated blood loss was                            minimal.                           The examined duodenum was normal. Biopsies for                            histology were taken with a cold forceps for                            evaluation of celiac disease. Verification of                            patient identification for the specimen was done.                            Estimated blood loss was minimal.                           The exam was otherwise without abnormality.                           The cardia and gastric fundus were normal on                            retroflexion. Complications:             No immediate complications. Estimated Blood Loss:     Estimated blood loss was minimal. Impression:               - No endoscopic esophageal abnormality to explain                            patient's dysphagia. Esophagus dilated. Dilated -                            54 Fr Maloney                           - Gastritis. Biopsied. Cells for cytology obtained.                           -  Normal examined duodenum. Biopsied.                           - The examination was otherwise normal. Recommendation:           - Patient has a contact number available for                            emergencies. The signs and symptoms of potential                            delayed complications were discussed with the                            patient. Return to normal activities tomorrow.                            Written discharge instructions were provided to the                            patient.                           - Clear liquids x 1 hour then soft foods rest of                            day. Start prior diet tomorrow.                           - Continue present medications.                           - Await pathology results. He denied NSAID's - ? H                            pylori - ? possible celiac disease                           - See the other procedure note for documentation of                            additional recommendations. Iva Boop, MD 12/11/2023 10:35:53 AM This report has been signed electronically. Addendum Number: 1   Addendum Date: 12/26/2023 2:05:30 PM      Correction: No cells for cytology were obtained. Iva Boop, MD 12/26/2023 2:05:54 PM This report has been signed electronically.

## 2023-12-11 NOTE — Progress Notes (Signed)
 Vss nad trans to pacu

## 2023-12-11 NOTE — Op Note (Signed)
 [facility] Patient Name: Norris Brumbach Procedure Date: 12/11/2023 9:34 AM MRN: [id number] Endoscopist: Iva Boop , MD, [id number] Age: 58 Referring MD:  Date of Birth: March 09, 1966 Gender: Male Account #: [id number] Procedure:                Colonoscopy Indications:              Surveillance: Personal history of adenomatous                            polyps on last colonoscopy > 5 years ago; also has                            iron-deficiency anemia Medicines:                Monitored Anesthesia Care Procedure:                Pre-Anesthesia Assessment:                           - Prior to the procedure, a History and Physical                            was performed, and patient medications and                            allergies were reviewed. The patient's tolerance of                            previous anesthesia was also reviewed. The risks                            and benefits of the procedure and the sedation                            options and risks were discussed with the patient.                            All questions were answered, and informed consent                            was obtained. Prior Anticoagulants: The patient has                            taken no anticoagulant or antiplatelet agents. ASA                            Grade Assessment: III - A patient with severe                            systemic disease. After reviewing the risks and                            benefits, the patient was deemed in satisfactory  condition to undergo the procedure.                           - Prior to the procedure, a History and Physical                            was performed, and patient medications and                            allergies were reviewed. The patient's tolerance of                            previous anesthesia was also reviewed. The risks                            and benefits of the procedure and the  sedation                            options and risks were discussed with the patient.                            All questions were answered, and informed consent                            was obtained. Prior Anticoagulants: The patient has                            taken no anticoagulant or antiplatelet agents. ASA                            Grade Assessment: III - A patient with severe                            systemic disease. After reviewing the risks and                            benefits, the patient was deemed in satisfactory                            condition to undergo the procedure.                           After obtaining informed consent, the colonoscope                            was passed under direct vision. Throughout the                            procedure, the patient's blood pressure, pulse, and                            oxygen saturations were monitored continuously. The  Olympus CF-[id number] ([id number]) Colonoscope was                            introduced through the anus and advanced to the the                            cecum, identified by appendiceal orifice and                            ileocecal valve. The colonoscopy was somewhat                            difficult due to significant looping. Successful                            completion of the procedure was aided by applying                            abdominal pressure. The patient tolerated the                            procedure well. The quality of the bowel                            preparation was adequate. The ileocecal valve,                            appendiceal orifice, and rectum were photographed.                            The bowel preparation used was Miralax via split                            dose instruction. Scope In: 9:52:20 AM Scope Out: 10:22:48 AM Scope Withdrawal Time: 0 hours 25 minutes 11 seconds  Total Procedure Duration: 0 hours 30 minutes  28 seconds  Findings:                 The perianal and digital rectal examinations were                            normal. Pertinent negatives include normal prostate                            (size, shape, and consistency).                           Three sessile polyps were found in the transverse                            colon. The polyps were diminutive in size. These                            polyps were removed with a cold snare. Resection  and retrieval were complete. Verification of                            patient identification for the specimen was done.                            Estimated blood loss was minimal.                           Internal hemorrhoids were found.                           The exam was otherwise without abnormality on                            direct and retroflexion views. Complications:            No immediate complications. Estimated Blood Loss:     Estimated blood loss was minimal. Impression:               - Three diminutive polyps in the transverse colon,                            removed with a cold snare. Resected and retrieved.                           - Internal hemorrhoids.                           - The examination was otherwise normal on direct                            and retroflexion views. Recommendation:           - Patient has a contact number available for                            emergencies. The signs and symptoms of potential                            delayed complications were discussed with the                            patient. Return to normal activities tomorrow.                            Written discharge instructions were provided to the                            patient.                           - Clear liquids x 1 hour then soft foods rest of                            day. Start prior diet tomorrow.                           -  Continue present medications.                            - Repeat colonoscopy is recommended for                            surveillance. The colonoscopy date will be                            determined after pathology results from today's                            exam become available for review.                           - Ferrous fumarate 325 mg qod (daily ferrous                            sulfate constipated in past)                           - See the other procedure note for documentation of                            additional recommendations. Iva Boop, MD 12/11/2023 10:42:05 AM This report has been signed electronically.

## 2023-12-11 NOTE — Patient Instructions (Addendum)
 The upper endoscopy exam revealed some stomach inflammation - biopsies taken. I also biopsied the upper intestine to look fr signs of iron malabsorption - that can happen with gluten allergy so checking on that.  I dilated the esophagus to see if that helps you swallow better.  Three small colon polyps removed and hemorrhoids seen on the colonoscopy.  Once I see pathology results I will contact you with treatment plans.  You should take iron pills - I have added ferrous fumarate to your list - try that every other day  since you were constipated on a daily iron pill before.  I appreciate the opportunity to care for you.  Iva Boop, MD, FACG   YOU HAD AN ENDOSCOPIC PROCEDURE TODAY AT THE [facility]:   Refer to the procedure report that was given to you for any specific questions about what was found during the examination.  If the procedure report does not answer your questions, please call your gastroenterologist to clarify.  If you requested that your care partner not be given the details of your procedure findings, then the procedure report has been included in a sealed envelope for you to review at your convenience later.  YOU SHOULD EXPECT: Some feelings of bloating in the abdomen. Passage of more gas than usual.  Walking can help get rid of the air that was put into your GI tract during the procedure and reduce the bloating. If you had a lower endoscopy (such as a colonoscopy or flexible sigmoidoscopy) you may notice spotting of blood in your stool or on the toilet paper. If you underwent a bowel prep for your procedure, you may not have a normal bowel movement for a few days.  Please Note:  You might notice some irritation and congestion in your nose or some drainage.  This is from the oxygen used during your procedure.  There is no need for concern and it should clear up in a day or so.  SYMPTOMS TO REPORT IMMEDIATELY:  Following lower endoscopy (colonoscopy or  flexible sigmoidoscopy):  Excessive amounts of blood in the stool  Significant tenderness or worsening of abdominal pains  Swelling of the abdomen that is new, acute  Fever of 100F or higher  Following upper endoscopy (EGD)  Vomiting of blood or coffee ground material  New chest pain or pain under the shoulder blades  Painful or persistently difficult swallowing  New shortness of breath  Fever of 100F or higher  Black, tarry-looking stools  For urgent or emergent issues, a gastroenterologist can be reached at any hour by calling [phone]. Do not use MyChart messaging for urgent concerns.    DIET:  We do recommend a small meal at first, but then you may proceed to your regular diet.  Drink plenty of fluids but you should avoid alcoholic beverages for 24 hours.  ACTIVITY:  You should plan to take it easy for the rest of today and you should NOT DRIVE or use heavy machinery until tomorrow (because of the sedation medicines used during the test).    FOLLOW UP: Our staff will call the number listed on your records the next business day following your procedure.  We will call around 7:15- 8:00 am to check on you and address any questions or concerns that you may have regarding the information given to you following your procedure. If we do not reach you, we will leave a message.     If any biopsies were taken you  will be contacted by phone or by letter within the next 1-3 weeks.  Please call us at [phone] if you have not heard about the biopsies in 3 weeks.    SIGNATURES/CONFIDENTIALITY: You and/or your care partner have signed paperwork which will be entered into your electronic medical record.  These signatures attest to the fact that that the information above on your After Visit Summary has been reviewed and is understood.  Full responsibility of the confidentiality of this discharge information lies with you and/or your care-partner.

## 2023-12-12 ENCOUNTER — Telehealth: Payer: Self-pay | Admitting: *Deleted

## 2023-12-12 NOTE — Telephone Encounter (Signed)
  Follow up Call-     12/11/2023    8:29 AM  Call back number  Post procedure Call Back phone  # [phone]  Permission to leave phone message Yes     Patient questions:  Do you have a fever, pain , or abdominal swelling? No. Pain Score  0 *  Have you tolerated food without any problems? Yes.    Have you been able to return to your normal activities? Yes.    Do you have any questions about your discharge instructions: Diet   No. Medications  No. Follow up visit  No.  Do you have questions or concerns about your Care? No.  Actions: * If pain score is 4 or above: No action needed, pain <4.

## 2023-12-13 LAB — SURGICAL PATHOLOGY

## 2023-12-20 ENCOUNTER — Encounter: Payer: Self-pay | Admitting: Internal Medicine
